# Patient Record
Sex: Female | Born: 1960 | Race: White | Hispanic: No | Marital: Married | Smoking: Current every day smoker
Health system: Southern US, Community
[De-identification: ages and names within clinical notes are randomized; demographics above are authoritative.]

---

## 2002-07-20 ENCOUNTER — Encounter: Payer: Self-pay | Admitting: Internal Medicine

## 2002-07-20 ENCOUNTER — Encounter: Admission: RE | Admit: 2002-07-20 | Discharge: 2002-07-20 | Payer: Self-pay | Admitting: Internal Medicine

## 2003-10-24 ENCOUNTER — Encounter: Admission: RE | Admit: 2003-10-24 | Discharge: 2003-10-24 | Payer: Self-pay | Admitting: Internal Medicine

## 2004-04-16 ENCOUNTER — Encounter: Admission: RE | Admit: 2004-04-16 | Discharge: 2004-04-16 | Payer: Self-pay | Admitting: Internal Medicine

## 2004-11-09 ENCOUNTER — Encounter: Admission: RE | Admit: 2004-11-09 | Discharge: 2004-11-09 | Payer: Self-pay | Admitting: Internal Medicine

## 2004-11-24 ENCOUNTER — Encounter: Admission: RE | Admit: 2004-11-24 | Discharge: 2004-11-24 | Payer: Self-pay | Admitting: Internal Medicine

## 2005-06-02 ENCOUNTER — Encounter: Admission: RE | Admit: 2005-06-02 | Discharge: 2005-06-02 | Payer: Self-pay | Admitting: Internal Medicine

## 2005-11-15 ENCOUNTER — Encounter: Admission: RE | Admit: 2005-11-15 | Discharge: 2005-11-15 | Payer: Self-pay | Admitting: Internal Medicine

## 2007-09-13 ENCOUNTER — Encounter: Admission: RE | Admit: 2007-09-13 | Discharge: 2007-09-13 | Payer: Self-pay | Admitting: Internal Medicine

## 2007-09-13 ENCOUNTER — Emergency Department (HOSPITAL_COMMUNITY): Admission: EM | Admit: 2007-09-13 | Discharge: 2007-09-13 | Payer: Self-pay | Admitting: Emergency Medicine

## 2010-03-12 ENCOUNTER — Encounter: Admission: RE | Admit: 2010-03-12 | Discharge: 2010-03-12 | Payer: Self-pay | Admitting: Internal Medicine

## 2010-09-20 ENCOUNTER — Encounter: Payer: Self-pay | Admitting: Internal Medicine

## 2011-05-04 ENCOUNTER — Other Ambulatory Visit: Payer: Self-pay | Admitting: Internal Medicine

## 2011-05-04 DIAGNOSIS — Z1231 Encounter for screening mammogram for malignant neoplasm of breast: Secondary | ICD-10-CM

## 2011-05-12 ENCOUNTER — Ambulatory Visit
Admission: RE | Admit: 2011-05-12 | Discharge: 2011-05-12 | Disposition: A | Discharge status: Home / Self Care | Payer: BC Managed Care – PPO | Source: Ambulatory Visit | Attending: Internal Medicine | Admitting: Internal Medicine

## 2011-05-12 DIAGNOSIS — Z1231 Encounter for screening mammogram for malignant neoplasm of breast: Secondary | ICD-10-CM

## 2011-05-20 LAB — I-STAT 8, (EC8 V) (CONVERTED LAB)
Bicarbonate: 26.4 — ABNORMAL HIGH
Potassium: 3.8
TCO2: 28
pCO2, Ven: 44.4 — ABNORMAL LOW

## 2012-06-13 ENCOUNTER — Other Ambulatory Visit: Payer: Self-pay | Admitting: Internal Medicine

## 2012-06-13 DIAGNOSIS — Z1231 Encounter for screening mammogram for malignant neoplasm of breast: Secondary | ICD-10-CM

## 2012-07-05 ENCOUNTER — Ambulatory Visit
Admission: RE | Admit: 2012-07-05 | Discharge: 2012-07-05 | Disposition: A | Discharge status: Home / Self Care | Payer: BC Managed Care – PPO | Source: Ambulatory Visit | Attending: Internal Medicine | Admitting: Internal Medicine

## 2012-07-05 DIAGNOSIS — Z1231 Encounter for screening mammogram for malignant neoplasm of breast: Secondary | ICD-10-CM

## 2015-02-17 ENCOUNTER — Ambulatory Visit
Admission: RE | Admit: 2015-02-17 | Discharge: 2015-02-17 | Disposition: A | Discharge status: Home / Self Care | Payer: Commercial Managed Care - PPO | Source: Ambulatory Visit | Attending: Internal Medicine | Admitting: Internal Medicine

## 2015-02-17 ENCOUNTER — Other Ambulatory Visit: Payer: Self-pay | Admitting: Internal Medicine

## 2015-02-17 DIAGNOSIS — R079 Chest pain, unspecified: Secondary | ICD-10-CM

## 2015-12-04 ENCOUNTER — Other Ambulatory Visit: Payer: Self-pay

## 2015-12-04 ENCOUNTER — Other Ambulatory Visit: Payer: Self-pay | Admitting: Internal Medicine

## 2015-12-04 DIAGNOSIS — Z1231 Encounter for screening mammogram for malignant neoplasm of breast: Secondary | ICD-10-CM

## 2015-12-23 ENCOUNTER — Ambulatory Visit: Payer: Commercial Managed Care - PPO

## 2016-01-12 ENCOUNTER — Ambulatory Visit
Admission: RE | Admit: 2016-01-12 | Discharge: 2016-01-12 | Disposition: A | Discharge status: Home / Self Care | Payer: Commercial Managed Care - PPO | Source: Ambulatory Visit | Attending: Internal Medicine | Admitting: Internal Medicine

## 2016-01-12 DIAGNOSIS — Z1231 Encounter for screening mammogram for malignant neoplasm of breast: Secondary | ICD-10-CM

## 2017-05-10 ENCOUNTER — Other Ambulatory Visit: Payer: Self-pay | Admitting: Internal Medicine

## 2017-05-10 ENCOUNTER — Ambulatory Visit
Admission: RE | Admit: 2017-05-10 | Discharge: 2017-05-10 | Disposition: A | Discharge status: Home / Self Care | Payer: Commercial Managed Care - PPO | Source: Ambulatory Visit | Attending: Internal Medicine | Admitting: Internal Medicine

## 2017-05-10 DIAGNOSIS — Z720 Tobacco use: Secondary | ICD-10-CM

## 2017-05-10 DIAGNOSIS — M79601 Pain in right arm: Secondary | ICD-10-CM

## 2019-06-04 ENCOUNTER — Other Ambulatory Visit: Payer: Self-pay

## 2019-06-04 ENCOUNTER — Other Ambulatory Visit: Payer: Self-pay | Admitting: Internal Medicine

## 2019-06-04 DIAGNOSIS — Z1231 Encounter for screening mammogram for malignant neoplasm of breast: Secondary | ICD-10-CM

## 2019-08-28 ENCOUNTER — Ambulatory Visit
Admission: RE | Admit: 2019-08-28 | Discharge: 2019-08-28 | Disposition: A | Discharge status: Home / Self Care | Payer: Commercial Managed Care - PPO | Source: Ambulatory Visit | Attending: Internal Medicine | Admitting: Internal Medicine

## 2019-08-28 ENCOUNTER — Other Ambulatory Visit: Payer: Self-pay

## 2019-08-28 DIAGNOSIS — Z1231 Encounter for screening mammogram for malignant neoplasm of breast: Secondary | ICD-10-CM

## 2019-08-29 ENCOUNTER — Other Ambulatory Visit: Payer: Self-pay | Admitting: Internal Medicine

## 2019-08-29 DIAGNOSIS — R928 Other abnormal and inconclusive findings on diagnostic imaging of breast: Secondary | ICD-10-CM

## 2019-09-03 ENCOUNTER — Other Ambulatory Visit: Payer: Self-pay | Admitting: Internal Medicine

## 2019-09-03 ENCOUNTER — Ambulatory Visit
Admission: RE | Admit: 2019-09-03 | Discharge: 2019-09-03 | Disposition: A | Discharge status: Home / Self Care | Payer: Commercial Managed Care - PPO | Source: Ambulatory Visit | Attending: Internal Medicine | Admitting: Internal Medicine

## 2019-09-03 ENCOUNTER — Other Ambulatory Visit: Payer: Self-pay

## 2019-09-03 DIAGNOSIS — R921 Mammographic calcification found on diagnostic imaging of breast: Secondary | ICD-10-CM

## 2019-09-03 DIAGNOSIS — R928 Other abnormal and inconclusive findings on diagnostic imaging of breast: Secondary | ICD-10-CM

## 2019-09-10 ENCOUNTER — Other Ambulatory Visit: Payer: Self-pay

## 2019-09-10 ENCOUNTER — Ambulatory Visit
Admission: RE | Admit: 2019-09-10 | Discharge: 2019-09-10 | Disposition: A | Discharge status: Home / Self Care | Payer: Commercial Managed Care - PPO | Source: Ambulatory Visit | Attending: Internal Medicine | Admitting: Internal Medicine

## 2019-09-10 ENCOUNTER — Other Ambulatory Visit: Payer: Self-pay | Admitting: Internal Medicine

## 2019-09-10 DIAGNOSIS — R921 Mammographic calcification found on diagnostic imaging of breast: Secondary | ICD-10-CM

## 2019-09-19 ENCOUNTER — Ambulatory Visit: Payer: Commercial Managed Care - PPO | Attending: Internal Medicine

## 2019-09-19 DIAGNOSIS — Z23 Encounter for immunization: Secondary | ICD-10-CM | POA: Insufficient documentation

## 2019-09-19 NOTE — Progress Notes (Signed)
   Covid-19 Vaccination Clinic  Name:  ALIZEY NOREN    MRN: [id number] DOB: [birth year]  09/19/2019  Ms. Hildenbrand was observed post Covid-19 immunization for 15 minutes without incidence. She was provided with Vaccine Information Sheet and instruction to access the V-Safe system.   Ms. Lasota was instructed to call 911 with any severe reactions post vaccine: Marland Kitchen Difficulty breathing  . Swelling of your face and throat  . A fast heartbeat  . A bad rash all over your body  . Dizziness and weakness    Immunizations Administered    Name Date Dose VIS Date Route   Pfizer COVID-19 Vaccine 09/19/2019  1:18 PM 0.3 mL 08/10/2019 Intramuscular   Manufacturer: [employer], [employer]   Lot: [id number]   NDC: [address]-1

## 2019-10-02 ENCOUNTER — Ambulatory Visit
Admission: RE | Admit: 2019-10-02 | Discharge: 2019-10-02 | Disposition: A | Discharge status: Home / Self Care | Payer: Commercial Managed Care - PPO | Source: Ambulatory Visit | Attending: Internal Medicine | Admitting: Internal Medicine

## 2019-10-02 ENCOUNTER — Other Ambulatory Visit: Payer: Self-pay

## 2019-10-02 DIAGNOSIS — R921 Mammographic calcification found on diagnostic imaging of breast: Secondary | ICD-10-CM

## 2019-10-07 ENCOUNTER — Ambulatory Visit: Payer: Commercial Managed Care - PPO

## 2019-10-10 ENCOUNTER — Ambulatory Visit: Payer: Commercial Managed Care - PPO | Attending: Internal Medicine

## 2019-10-10 DIAGNOSIS — Z23 Encounter for immunization: Secondary | ICD-10-CM | POA: Insufficient documentation

## 2019-10-10 NOTE — Progress Notes (Signed)
   Covid-19 Vaccination Clinic  Name:  Robyn Young    MRN: [id number] DOB: 1961/08/03  10/10/2019  [name] was observed post Covid-19 immunization for 15 minutes without incidence. She was provided with Vaccine Information Sheet and instruction to access the V-Safe system.   [name] was instructed to call 911 with any severe reactions post vaccine: Marland Kitchen Difficulty breathing  . Swelling of your face and throat  . A fast heartbeat  . A bad rash all over your body  . Dizziness and weakness    Immunizations Administered    Name Date Dose VIS Date Route   Pfizer COVID-19 Vaccine 10/10/2019  3:03 PM 0.3 mL 08/10/2019 Intramuscular   Manufacturer: [employer], [employer]   Lot: [id number]   NDC: [address]-4

## 2020-08-01 ENCOUNTER — Other Ambulatory Visit: Payer: Self-pay

## 2020-08-01 ENCOUNTER — Other Ambulatory Visit: Payer: Self-pay | Admitting: Internal Medicine

## 2020-08-01 ENCOUNTER — Ambulatory Visit
Admission: RE | Admit: 2020-08-01 | Discharge: 2020-08-01 | Disposition: A | Discharge status: Home / Self Care | Payer: Commercial Managed Care - PPO | Source: Ambulatory Visit | Attending: Internal Medicine | Admitting: Internal Medicine

## 2020-08-01 DIAGNOSIS — M549 Dorsalgia, unspecified: Secondary | ICD-10-CM

## 2021-02-19 ENCOUNTER — Other Ambulatory Visit: Payer: Self-pay | Admitting: Internal Medicine

## 2021-02-19 DIAGNOSIS — F17210 Nicotine dependence, cigarettes, uncomplicated: Secondary | ICD-10-CM

## 2021-08-08 ENCOUNTER — Other Ambulatory Visit: Payer: Self-pay | Admitting: Internal Medicine

## 2021-08-08 DIAGNOSIS — F17210 Nicotine dependence, cigarettes, uncomplicated: Secondary | ICD-10-CM

## 2021-10-15 ENCOUNTER — Ambulatory Visit
Admission: RE | Admit: 2021-10-15 | Discharge: 2021-10-15 | Disposition: A | Discharge status: Home / Self Care | Payer: PRIVATE HEALTH INSURANCE | Source: Ambulatory Visit | Attending: Internal Medicine | Admitting: Internal Medicine

## 2021-10-15 ENCOUNTER — Other Ambulatory Visit: Payer: Self-pay

## 2021-10-15 DIAGNOSIS — F17210 Nicotine dependence, cigarettes, uncomplicated: Secondary | ICD-10-CM

## 2021-12-20 ENCOUNTER — Emergency Department (HOSPITAL_BASED_OUTPATIENT_CLINIC_OR_DEPARTMENT_OTHER): Payer: PRIVATE HEALTH INSURANCE

## 2021-12-20 ENCOUNTER — Emergency Department (HOSPITAL_BASED_OUTPATIENT_CLINIC_OR_DEPARTMENT_OTHER)
Admission: EM | Admit: 2021-12-20 | Discharge: 2021-12-20 | Disposition: A | Discharge status: Home / Self Care | Payer: PRIVATE HEALTH INSURANCE | Attending: Emergency Medicine | Admitting: Emergency Medicine

## 2021-12-20 ENCOUNTER — Other Ambulatory Visit: Payer: Self-pay

## 2021-12-20 ENCOUNTER — Emergency Department (HOSPITAL_BASED_OUTPATIENT_CLINIC_OR_DEPARTMENT_OTHER): Payer: PRIVATE HEALTH INSURANCE | Admitting: Radiology

## 2021-12-20 ENCOUNTER — Encounter (HOSPITAL_BASED_OUTPATIENT_CLINIC_OR_DEPARTMENT_OTHER): Payer: Self-pay

## 2021-12-20 DIAGNOSIS — S89302A Unspecified physeal fracture of lower end of left fibula, initial encounter for closed fracture: Secondary | ICD-10-CM | POA: Insufficient documentation

## 2021-12-20 DIAGNOSIS — S82832A Other fracture of upper and lower end of left fibula, initial encounter for closed fracture: Secondary | ICD-10-CM

## 2021-12-20 DIAGNOSIS — S8992XA Unspecified injury of left lower leg, initial encounter: Secondary | ICD-10-CM | POA: Diagnosis present

## 2021-12-20 DIAGNOSIS — X501XXA Overexertion from prolonged static or awkward postures, initial encounter: Secondary | ICD-10-CM | POA: Insufficient documentation

## 2021-12-20 MED ORDER — OXYCODONE-ACETAMINOPHEN 5-325 MG PO TABS
1.0000 | ORAL_TABLET | ORAL | Status: DC | PRN
  Administered 2021-12-20: 1 via ORAL
  Filled 2021-12-20: qty 1

## 2021-12-20 MED ORDER — OXYCODONE-ACETAMINOPHEN 5-325 MG PO TABS: 1.0000 | ORAL_TABLET | Freq: Four times a day (QID) | ORAL | 0 refills | Status: AC | PRN

## 2021-12-20 NOTE — ED Provider Notes (Signed)
?[facility] ?Provider Note ? ? ?CSN: [id number] ?Arrival date & time: 12/20/21  1832 ? ?  ? ?History ? ?Chief Complaint  ?Patient presents with  ? Ankle Pain  ? ? ?Robyn Young is a 61 y.o. female. ? ?Patient is a 4-year-old female who presents with pain in her left ankle.  She was stepping into a manhole that the cover was not securely attached on.  She twisted her ankle.  This happened earlier today.  She has had swelling and pain to the area.  She has difficulty bearing weight.  She denies any other injuries.  She denies hitting her head.  No loss of conscious.  No neck or back pain.  She is not on anticoagulants. ? ? ?  ? ?Home Medications ?Prior to Admission medications   ?Medication Sig Start Date End Date Taking? Authorizing Provider  ?oxyCODONE-acetaminophen (PERCOCET/ROXICET) 5-325 MG tablet Take 1-2 tablets by mouth every 6 (six) hours as needed for severe pain. 12/20/21  Yes Rolan Bucco, MD  ?   ? ?Allergies    ?Lidocaine-epinephrine   ? ?Review of Systems   ?Review of Systems  ?Constitutional:  Negative for fever.  ?Gastrointestinal:  Negative for nausea and vomiting.  ?Musculoskeletal:  Positive for arthralgias and joint swelling. Negative for back pain and neck pain.  ?Skin:  Negative for wound.  ?Neurological:  Negative for weakness, numbness and headaches.  ? ?Physical Exam ?Updated Vital Signs ?BP (!) 172/69 (BP Location: Right Arm)   Pulse 67   Temp 98.3 ?F (36.8 ?C)   Resp 20   Ht 5\' 5"  (1.651 m)   Wt 81.6 kg   SpO2 97%   BMI 29.95 kg/m?  ?Physical Exam ?Constitutional:   ?   Appearance: She is well-developed.  ?HENT:  ?   Head: Normocephalic and atraumatic.  ?Cardiovascular:  ?   Rate and Rhythm: Normal rate.  ?Pulmonary:  ?   Effort: Pulmonary effort is normal.  ?Musculoskeletal:     ?   General: Tenderness present.  ?   Cervical back: Normal range of motion and neck supple.  ?   Comments: Positive swelling and ecchymosis over the lateral aspect of the  left ankle as well as the left foot.  There is tenderness over the lateral malleolus of the left ankle and the fourth and fifth metatarsals of the left foot.  Pedal pulses are intact.  There is no open wounds.  She has normal sensation and motor function distally.  No pain to the knee.  ?Skin: ?   General: Skin is warm and dry.  ?Neurological:  ?   Mental Status: She is alert and oriented to person, place, and time.  ? ? ?ED Results / Procedures / Treatments   ?Labs ?(all labs ordered are listed, but only abnormal results are displayed) ?Labs Reviewed - No data to display ? ?EKG ?None ? ?Radiology ?DG Ankle Complete Left ? ?Result Date: 12/20/2021 ?CLINICAL DATA:  injury with pain EXAM: LEFT ANKLE COMPLETE - 3+ VIEW COMPARISON:  None. FINDINGS: Infrasyndesmotic nondisplaced distal fibular fracture. No acute displaced fracture or dislocation of the ankle. Well corticated ossific density inferior to the medial malleolus likely old healed fracture. There is no evidence of arthropathy or other focal bone abnormality. Soft tissues are unremarkable. IMPRESSION: Infrasyndesmotic nondisplaced distal fibular fracture. Electronically Signed   By: 12/22/2021 M.D.   On: 12/20/2021 19:55  ? ?DG Foot Complete Left ? ?Result Date: 12/20/2021 ?CLINICAL DATA:  Fall, foot  pain EXAM: LEFT FOOT - COMPLETE 3+ VIEW COMPARISON:  None. FINDINGS: No fracture or dislocation is seen. The joint spaces are preserved. Visualized soft tissues are within normal limits. Tiny plantar and posterior calcaneal these of fights. IMPRESSION: Negative. Electronically Signed   By: Charline Bills M.D.   On: 12/20/2021 23:02   ? ?Procedures ?Procedures  ? ? ?Medications Ordered in ED ?Medications  ?oxyCODONE-acetaminophen (PERCOCET/ROXICET) 5-325 MG per tablet 1 tablet (1 tablet Oral Given 12/20/21 2216)  ? ? ?ED Course/ Medical Decision Making/ A&P ?  ?                        ?Medical Decision Making ?Amount and/or Complexity of Data  Reviewed ?Radiology: ordered. ? ?Risk ?Prescription drug management. ? ? ?Patient x-rays of the left ankle and the left foot.  These are interpreted by me.  She has evidence of a nondisplaced distal fibula fracture.  She was placed in a cam walker.  She was advised on ice and elevation.  She was given a referral to follow-up with orthopedics.  She was advised to call tomorrow for an appointment for follow-up.  She was given a prescription for short course of Percocet but also was advised to use ibuprofen for symptomatic relief. ? ?Final Clinical Impression(s) / ED Diagnoses ?Final diagnoses:  ?Closed fracture of distal end of left fibula, unspecified fracture morphology, initial encounter  ? ? ?Rx / DC Orders ?ED Discharge Orders   ? ?      Ordered  ?  oxyCODONE-acetaminophen (PERCOCET/ROXICET) 5-325 MG tablet  Every 6 hours PRN       ? 12/20/21 2320  ? ?  ?  ? ?  ? ? ?  ?Rolan Bucco, MD ?12/20/21 2323 ? ?

## 2021-12-20 NOTE — ED Notes (Signed)
Pt discharged home after verbalizing understanding of discharge instructions; nad noted. 

## 2021-12-20 NOTE — ED Triage Notes (Signed)
Pt stepped into a manhole while walking downtown. Pt reports injury to her L ankle. Significant swelling noted.  ?

## 2021-12-23 ENCOUNTER — Ambulatory Visit (INDEPENDENT_AMBULATORY_CARE_PROVIDER_SITE_OTHER): Payer: PRIVATE HEALTH INSURANCE | Admitting: Orthopaedic Surgery

## 2021-12-23 DIAGNOSIS — S8265XA Nondisplaced fracture of lateral malleolus of left fibula, initial encounter for closed fracture: Secondary | ICD-10-CM

## 2021-12-23 NOTE — Progress Notes (Signed)
? ?                            ? ? ?Chief Complaint: Left ankle pain ?  ? ? ?History of Present Illness:  ? ? ?Robyn Young is a 61 y.o. female presents with left ankle pain after she slipped on a manhole cover and inverted her ankle.  She went to the emergency room on April 23 and subsequently was placed in a cam boot and given crutches.  She presents today for follow-up.  She has been trying to keep her weight off of it since this time.  She has been taking ibuprofen and occasionally Percocet to help sleep.  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  She is here today with her husband JT ? ? ? ?Surgical History:   ?None ? ?PMH/PSH/Family History/Social History/Meds/Allergies:   ?No past medical history on file. ?No past surgical history on file. ?Social History  ? ?Socioeconomic History  ? Marital status: Married  ?  Spouse name: Not on file  ? Number of children: Not on file  ? Years of education: Not on file  ? Highest education level: Not on file  ?Occupational History  ? Not on file  ?Tobacco Use  ? Smoking status: Every Day  ?  Types: Cigarettes  ? Smokeless tobacco: Never  ?Vaping Use  ? Vaping Use: Never used  ?Substance and Sexual Activity  ? Alcohol use: Yes  ? Drug use: Never  ? Sexual activity: Not on file  ?Other Topics Concern  ? Not on file  ?Social History Narrative  ? Not on file  ? ?Social Determinants of Health  ? ?Financial Resource Strain: Not on file  ?Food Insecurity: Not on file  ?Transportation Needs: Not on file  ?Physical Activity: Not on file  ?Stress: Not on file  ?Social Connections: Not on file  ? ?No family history on file. ?Allergies  ?Allergen Reactions  ? Lidocaine-Epinephrine Rash and Other (See Comments)  ?  She had a breast biopsy 09/10/2019 with a severe vasovagal response and took over an hour to recover.  She had a rash that developed on her back within a day afterward.  She said it took a week to recover her energy.  Unclear if this is  all related to the drug, but I repeated a biopsy 10/02/19 and used 2% Nesacaine (no epi), and she had no issues.  ? ?Current Outpatient Medications  ?Medication Sig Dispense Refill  ? oxyCODONE-acetaminophen (PERCOCET/ROXICET) 5-325 MG tablet Take 1-2 tablets by mouth every 6 (six) hours as needed for severe pain. 10 tablet 0  ? ?No current facility-administered medications for this visit.  ? ?No results found. ? ?Review of Systems:   ?A ROS was performed including pertinent positives and negatives as documented in the HPI. ? ?Physical Exam :   ?Constitutional: NAD and appears stated age ?Neurological: Alert and oriented ?Psych: Appropriate affect and cooperative ?There were no vitals taken for this visit.  ? ?Comprehensive Musculoskeletal Exam:   ? ? Right Left  ?Gait Antalgic  ?Musculoskeletal Exam    ?Deformity No deformity No deformity  ?Tenderness None Distal fibula  ?Skin    ?Abrasions None None  ?Blisters None None  ?Range of Motion    ?Ankle Dorsiflexion 20 10  ?Ankle Plantarflexion 30 10  ?Subtalar Joint Inversion/Eversion Full Deferred  ?Stability    ?Dislocations None None  ?  Subluxations or Laxity None None  ?Muscle Strength    ?Single Heel Raise Able Able  ?Sensation    ?Sural Nerve Dist. Normal Normal  ?Saphenous Nerve Dist. Normal Normal  ?Tibial Nerve Dist. Normal Normal  ?Deep Peroneal Nerve Dist. Normal Normal  ?Superficial Peroneal Nerve Dist. Normal Normal  ?Cardiovascular     ?Varicosites None  None  ?DP Artery Pulse Palpable Palpable  ?Capillary Refill <2 sec <2 sec  ?Special Tests:  ?Bruising about the distal fibula  ? ? ? ?Imaging:   ?Xray (3 views left ankle): ?There is a Weber a fibula fracture as well as a very small medial avulsion ? ? ? ?I personally reviewed and interpreted the radiographs. ? ? ?Assessment:   ?61 y.o. female presents with a left Weber a fibular fracture after a trip on a manhole cover.  I described that in general these fractures are very stable.  At this time I would like  her to progress her weightbearing in her cam boot.  I will plan to see her back in 2 weeks.  She was advised on strictly icing and elevating in order to help with swelling. ? ?Plan :   ? ?-Return to clinic in 2 weeks ? ? ? ? ?I personally saw and evaluated the patient, and participated in the management and treatment plan. ? ?Huel Cote, MD ?Attending Physician, Orthopedic Surgery ? ?This document was dictated using Conservation officer, historic buildings. A reasonable attempt at proof reading has been made to minimize errors. ?

## 2022-01-06 ENCOUNTER — Ambulatory Visit (INDEPENDENT_AMBULATORY_CARE_PROVIDER_SITE_OTHER): Payer: PRIVATE HEALTH INSURANCE

## 2022-01-06 ENCOUNTER — Ambulatory Visit (INDEPENDENT_AMBULATORY_CARE_PROVIDER_SITE_OTHER): Payer: PRIVATE HEALTH INSURANCE | Admitting: Orthopaedic Surgery

## 2022-01-06 DIAGNOSIS — S8265XA Nondisplaced fracture of lateral malleolus of left fibula, initial encounter for closed fracture: Secondary | ICD-10-CM

## 2022-01-06 DIAGNOSIS — G5601 Carpal tunnel syndrome, right upper limb: Secondary | ICD-10-CM

## 2022-01-06 DIAGNOSIS — M65331 Trigger finger, right middle finger: Secondary | ICD-10-CM

## 2022-01-06 NOTE — Progress Notes (Signed)
? ?                            ? ? ?Chief Complaint: Left ankle pain ?  ? ? ?History of Present Illness:  ? ? ?Robyn Young is a 61 y.o. female presents follow-up of her left distal fibula fracture.  She has been walking on her cam boot.  She has been able to wean off of this recently.  She was recently able to walk about her house without any pain.  She does have some calling discoloration as well as swelling although this overall continues to improve. ? ? ? ?Surgical History:   ?None ? ?PMH/PSH/Family History/Social History/Meds/Allergies:   ?No past medical history on file. ?No past surgical history on file. ?Social History  ? ?Socioeconomic History  ? Marital status: Married  ?  Spouse name: Not on file  ? Number of children: Not on file  ? Years of education: Not on file  ? Highest education level: Not on file  ?Occupational History  ? Not on file  ?Tobacco Use  ? Smoking status: Every Day  ?  Types: Cigarettes  ? Smokeless tobacco: Never  ?Vaping Use  ? Vaping Use: Never used  ?Substance and Sexual Activity  ? Alcohol use: Yes  ? Drug use: Never  ? Sexual activity: Not on file  ?Other Topics Concern  ? Not on file  ?Social History Narrative  ? Not on file  ? ?Social Determinants of Health  ? ?Financial Resource Strain: Not on file  ?Food Insecurity: Not on file  ?Transportation Needs: Not on file  ?Physical Activity: Not on file  ?Stress: Not on file  ?Social Connections: Not on file  ? ?No family history on file. ?Allergies  ?Allergen Reactions  ? Lidocaine-Epinephrine Rash and Other (See Comments)  ?  She had a breast biopsy 09/10/2019 with a severe vasovagal response and took over an hour to recover.  She had a rash that developed on her back within a day afterward.  She said it took a week to recover her energy.  Unclear if this is all related to the drug, but I repeated a biopsy 10/02/19 and used 2% Nesacaine (no epi), and she had no issues.  ? ?Current Outpatient Medications  ?Medication Sig Dispense  Refill  ? oxyCODONE-acetaminophen (PERCOCET/ROXICET) 5-325 MG tablet Take 1-2 tablets by mouth every 6 (six) hours as needed for severe pain. 10 tablet 0  ? ?No current facility-administered medications for this visit.  ? ?No results found. ? ?Review of Systems:   ?A ROS was performed including pertinent positives and negatives as documented in the HPI. ? ?Physical Exam :   ?Constitutional: NAD and appears stated age ?Neurological: Alert and oriented ?Psych: Appropriate affect and cooperative ?There were no vitals taken for this visit.  ? ?Comprehensive Musculoskeletal Exam:   ? ? Right Left  ?Gait Antalgic  ?Musculoskeletal Exam    ?Deformity No deformity No deformity  ?Tenderness None Distal fibula  ?Skin    ?Abrasions None None  ?Blisters None None  ?Range of Motion    ?Ankle Dorsiflexion 20 10  ?Ankle Plantarflexion 30 10  ?Subtalar Joint Inversion/Eversion Full Deferred  ?Stability    ?Dislocations None None  ?Subluxations or Laxity None None  ?Muscle Strength    ?Single Heel Raise Able Able  ?Sensation    ?Sural Nerve Dist. Normal Normal  ?Saphenous Nerve Dist. Normal Normal  ?Tibial Nerve Dist.  Normal Normal  ?Deep Peroneal Nerve Dist. Normal Normal  ?Superficial Peroneal Nerve Dist. Normal Normal  ?Cardiovascular     ?Varicosites None  None  ?DP Artery Pulse Palpable Palpable  ?Capillary Refill <2 sec <2 sec  ?Special Tests:  ?Bruising about the distal fibula  ? ? ? ?Imaging:   ?Xray (3 views left ankle): ?There is a Weber a fibula fracture as well as a very small medial avulsion, there is interval healing ? ? ? ?I personally reviewed and interpreted the radiographs. ? ? ?Assessment:   ?61 y.o. female presents with a left Weber a fibular fracture after a trip on a manhole cover.  She is overall doing much better from this perspective.  X-rays show healing.  At this time I would like her to continue weaning out of her brace and she will see her back in 6 weeks. ? ?Plan :   ? ?-Return to clinic in 6  weeks ?-Plan for referral to Dr. Frazier Butt for right hand carpal tunnel as well as index finger middle finger trigger fingers ? ? ? ? ?I personally saw and evaluated the patient, and participated in the management and treatment plan. ? ?Huel Cote, MD ?Attending Physician, Orthopedic Surgery ? ?This document was dictated using Conservation officer, historic buildings. A reasonable attempt at proof reading has been made to minimize errors. ?

## 2022-01-14 ENCOUNTER — Ambulatory Visit (INDEPENDENT_AMBULATORY_CARE_PROVIDER_SITE_OTHER): Payer: PRIVATE HEALTH INSURANCE | Admitting: Orthopedic Surgery

## 2022-01-14 ENCOUNTER — Encounter: Payer: Self-pay | Admitting: Orthopedic Surgery

## 2022-01-14 DIAGNOSIS — M65331 Trigger finger, right middle finger: Secondary | ICD-10-CM | POA: Diagnosis not present

## 2022-01-14 DIAGNOSIS — M65321 Trigger finger, right index finger: Secondary | ICD-10-CM | POA: Diagnosis not present

## 2022-01-14 DIAGNOSIS — R200 Anesthesia of skin: Secondary | ICD-10-CM | POA: Insufficient documentation

## 2022-01-14 DIAGNOSIS — R202 Paresthesia of skin: Secondary | ICD-10-CM

## 2022-01-14 MED ORDER — BETAMETHASONE SOD PHOS & ACET 6 (3-3) MG/ML IJ SUSP
6.0000 mg | INTRAMUSCULAR | Status: AC | PRN
  Administered 2022-01-14: 6 mg via INTRA_ARTICULAR

## 2022-01-14 NOTE — Progress Notes (Signed)
Office Visit Note   Patient: Robyn Young           Date of Birth: 05/28/1961           MRN: [id number] Visit Date: 01/14/2022              Requested by: Huel Cote, MD [address] [place],  [place] [address] PCP: Ralene Ok, MD   Assessment & Plan: Visit Diagnoses:  1. Numbness and tingling in right hand   2. Acquired trigger finger of right index finger   3. Trigger finger, right middle finger     Plan: We discussed that her numbness and tingling in the right hand may be consistent with carpal tunnel syndrome.  I like to get an EMG/nerve conduction study to further evaluate her symptoms.  We reviewed the nature of trigger finger as well as its diagnosis, prognosis, and both conservative and surgical treatment options.  After our discussion, she like to proceed with corticosteroid injection into the right middle and index finger A1 pulleys.  I can see her back once the electrodiagnostic study is completed and we can review the results.  Follow-Up Instructions: No follow-ups on file.   Orders:  No orders of the defined types were placed in this encounter.  No orders of the defined types were placed in this encounter.     Procedures: Hand/UE Inj: R index A1 for trigger finger on 01/14/2022 3:25 PM Indications: tendon swelling and therapeutic Details: 25 G needle, volar approach Medications: 6 mg betamethasone acetate-betamethasone sodium phosphate 6 (3-3) MG/ML Procedure, treatment alternatives, risks and benefits explained, specific risks discussed. Consent was given by the patient. Immediately prior to procedure a time out was called to verify the correct patient, procedure, equipment, support staff and site/side marked as required. Patient was prepped and draped in the usual sterile fashion.    Hand/UE Inj: R long A1 for trigger finger on 01/14/2022 3:25 PM Indications: tendon swelling and therapeutic Details: 25 G needle, volar  approach Medications: 6 mg betamethasone acetate-betamethasone sodium phosphate 6 (3-3) MG/ML Procedure, treatment alternatives, risks and benefits explained, specific risks discussed. Consent was given by the patient. Immediately prior to procedure a time out was called to verify the correct patient, procedure, equipment, support staff and site/side marked as required. Patient was prepped and draped in the usual sterile fashion.      Clinical Data: No additional findings.   Subjective: Chief Complaint  Patient presents with   Right Hand - New Patient (Initial Visit)    This is a 61 year old right-hand-dominant female who presents with numbness and paresthesias involving the index, middle, and occasionally thumb.  This been going on for several months now.  She has numbness during the day with certain activities such as prolonged typing.  She also has nocturnal symptoms 7 nights a week and when she has to wake and shake her hand for symptom relief.  She has not had any treatment for this so far.  She is never had any electrodiagnostic studies done.  She also notes painful swelling and locking of the right index and middle fingers since a ground-level fall in April when she landed on her outstretched right hand.  The index finger is most affected and will occasionally become stuck in a flexed position.   Review of Systems   Objective: Vital Signs: There were no vitals taken for this visit.  Physical Exam Constitutional:      Appearance: Normal appearance.  Cardiovascular:  Rate and Rhythm: Normal rate.     Pulses: Normal pulses.  Pulmonary:     Effort: Pulmonary effort is normal.  Skin:    General: Skin is warm and dry.     Capillary Refill: Capillary refill takes less than 2 seconds.  Neurological:     Mental Status: She is alert.    Right Hand Exam   Tenderness  Right hand tenderness location: TTP at index and middle fingers over A1 pulley.  Range of Motion  The  patient has normal right wrist ROM.   Other  Erythema: absent Sensation: normal Pulse: present  Comments:  Palpable nodule at A1 pulley of index finger.  Limited middle finger ROM secondary to pain and apprehension.  Equivocal Tinel sign.  Positive Phalen and Durkan signs.  5/5 thenar motor strength.      Specialty Comments:  No specialty comments available.  Imaging: No results found.   PMFS History: Patient Active Problem List   Diagnosis Date Noted   Numbness and tingling in right hand 01/14/2022   Acquired trigger finger of right index finger 01/14/2022   Trigger finger, right middle finger 01/14/2022   History reviewed. No pertinent past medical history.  History reviewed. No pertinent family history.  History reviewed. No pertinent surgical history. Social History   Occupational History   Not on file  Tobacco Use   Smoking status: Every Day    Types: Cigarettes   Smokeless tobacco: Never  Vaping Use   Vaping Use: Never used  Substance and Sexual Activity   Alcohol use: Yes   Drug use: Never   Sexual activity: Not on file

## 2022-02-17 ENCOUNTER — Ambulatory Visit (INDEPENDENT_AMBULATORY_CARE_PROVIDER_SITE_OTHER): Payer: PRIVATE HEALTH INSURANCE | Admitting: Orthopaedic Surgery

## 2022-02-17 ENCOUNTER — Ambulatory Visit (INDEPENDENT_AMBULATORY_CARE_PROVIDER_SITE_OTHER): Payer: PRIVATE HEALTH INSURANCE

## 2022-02-17 DIAGNOSIS — S8265XA Nondisplaced fracture of lateral malleolus of left fibula, initial encounter for closed fracture: Secondary | ICD-10-CM

## 2022-02-17 NOTE — Progress Notes (Signed)
Chief Complaint: Left ankle pain     History of Present Illness:    Robyn Young is a 61 y.o. female presents follow-up of her left distal fibula fracture.  At this time she has been walking essentially normally.  She does have some swelling at the end of a long day.  She recently went to [place] for a work trip and was able to walk in a normal shoe without much pain.  There is some soreness at the end of a long day.   Surgical History:   None  PMH/PSH/Family History/Social History/Meds/Allergies:   No past medical history on file. No past surgical history on file. Social History   Socioeconomic History   Marital status: Married    Spouse name: Not on file   Number of children: Not on file   Years of education: Not on file   Highest education level: Not on file  Occupational History   Not on file  Tobacco Use   Smoking status: Every Day    Types: Cigarettes   Smokeless tobacco: Never  Vaping Use   Vaping Use: Never used  Substance and Sexual Activity   Alcohol use: Yes   Drug use: Never   Sexual activity: Not on file  Other Topics Concern   Not on file  Social History Narrative   Not on file   Social Determinants of Health   Financial Resource Strain: Not on file  Food Insecurity: Not on file  Transportation Needs: Not on file  Physical Activity: Not on file  Stress: Not on file  Social Connections: Not on file   No family history on file. Allergies  Allergen Reactions   Lidocaine-Epinephrine Rash and Other (See Comments)    She had a breast biopsy 09/10/2019 with a severe vasovagal response and took over an hour to recover.  She had a rash that developed on her back within a day afterward.  She said it took a week to recover her energy.  Unclear if this is all related to the drug, but I repeated a biopsy 10/02/19 and used 2% Nesacaine (no epi), and she had no issues.   Current Outpatient Medications  Medication Sig  Dispense Refill   oxyCODONE-acetaminophen (PERCOCET/ROXICET) 5-325 MG tablet Take 1-2 tablets by mouth every 6 (six) hours as needed for severe pain. 10 tablet 0   No current facility-administered medications for this visit.   No results found.  Review of Systems:   A ROS was performed including pertinent positives and negatives as documented in the HPI.  Physical Exam :   Constitutional: NAD and appears stated age Neurological: Alert and oriented Psych: Appropriate affect and cooperative There were no vitals taken for this visit.   Comprehensive Musculoskeletal Exam:     Right Left  Gait Antalgic  Musculoskeletal Exam    Deformity No deformity No deformity  Tenderness None Distal fibula  Skin    Abrasions None None  Blisters None None  Range of Motion    Ankle Dorsiflexion 20 10  Ankle Plantarflexion 30 10  Subtalar Joint Inversion/Eversion Full Deferred  Stability    Dislocations None None  Subluxations or Laxity None None  Muscle Strength    Single Heel Raise Able Able  Sensation    Sural Nerve Dist. Normal Normal  Saphenous Nerve Dist.  Normal Normal  Tibial Nerve Dist. Normal Normal  Deep Peroneal Nerve Dist. Normal Normal  Superficial Peroneal Nerve Dist. Normal Normal  Cardiovascular     Varicosites None  None  DP Artery Pulse Palpable Palpable  Capillary Refill <2 sec <2 sec  Special Tests:  Mild tenderness about the distal fibula     Imaging:   Xray (3 views left ankle): There is a Weber a fibula fracture as well as a very small medial avulsion, there is some interval healing    I personally reviewed and interpreted the radiographs.   Assessment:   61 y.o. female presents with a left Weber a fibular fracture after a trip on a manhole cover.  X-rays show some interval callus although there still is a fracture lucency seen.  I will plan to see her back in 2 months for final x-ray.  At this time she may be activity as tolerated.  I have counseled her  on icing and elevating the ankle as well when she has been more active  Plan :    -Return to clinic in 8 weeks     I personally saw and evaluated the patient, and participated in the management and treatment plan.  Huel Cote, MD Attending Physician, Orthopedic Surgery  This document was dictated using Dragon voice recognition software. A reasonable attempt at proof reading has been made to minimize errors.

## 2022-02-24 ENCOUNTER — Other Ambulatory Visit: Payer: Self-pay | Admitting: Internal Medicine

## 2022-02-24 DIAGNOSIS — E2839 Other primary ovarian failure: Secondary | ICD-10-CM

## 2022-02-24 DIAGNOSIS — Z1231 Encounter for screening mammogram for malignant neoplasm of breast: Secondary | ICD-10-CM

## 2022-03-10 ENCOUNTER — Ambulatory Visit
Admission: RE | Admit: 2022-03-10 | Discharge: 2022-03-10 | Disposition: A | Discharge status: Home / Self Care | Payer: PRIVATE HEALTH INSURANCE | Source: Ambulatory Visit | Attending: Internal Medicine | Admitting: Internal Medicine

## 2022-03-10 DIAGNOSIS — Z1231 Encounter for screening mammogram for malignant neoplasm of breast: Secondary | ICD-10-CM

## 2022-04-14 ENCOUNTER — Ambulatory Visit (HOSPITAL_BASED_OUTPATIENT_CLINIC_OR_DEPARTMENT_OTHER): Payer: PRIVATE HEALTH INSURANCE | Admitting: Orthopaedic Surgery

## 2022-04-16 ENCOUNTER — Ambulatory Visit (INDEPENDENT_AMBULATORY_CARE_PROVIDER_SITE_OTHER): Payer: PRIVATE HEALTH INSURANCE

## 2022-04-16 ENCOUNTER — Ambulatory Visit (INDEPENDENT_AMBULATORY_CARE_PROVIDER_SITE_OTHER): Payer: PRIVATE HEALTH INSURANCE | Admitting: Orthopaedic Surgery

## 2022-04-16 DIAGNOSIS — S8265XA Nondisplaced fracture of lateral malleolus of left fibula, initial encounter for closed fracture: Secondary | ICD-10-CM

## 2022-04-16 NOTE — Progress Notes (Signed)
Chief Complaint: Left ankle pain     History of Present Illness:   04/16/2022: Follow-up for left ankle.  Overall she is doing extremely well.  She does have some swelling at the end of the long day but she overall continues to improve.  KHALAYA MCGURN is a 61 y.o. female presents follow-up of her left distal fibula fracture.  At this time she has been walking essentially normally.  She does have some swelling at the end of a long day.  She recently went to [place] for a work trip and was able to walk in a normal shoe without much pain.  There is some soreness at the end of a long day.   Surgical History:   None  PMH/PSH/Family History/Social History/Meds/Allergies:   No past medical history on file. No past surgical history on file. Social History   Socioeconomic History   Marital status: Married    Spouse name: Not on file   Number of children: Not on file   Years of education: Not on file   Highest education level: Not on file  Occupational History   Not on file  Tobacco Use   Smoking status: Every Day    Types: Cigarettes   Smokeless tobacco: Never  Vaping Use   Vaping Use: Never used  Substance and Sexual Activity   Alcohol use: Yes   Drug use: Never   Sexual activity: Not on file  Other Topics Concern   Not on file  Social History Narrative   Not on file   Social Determinants of Health   Financial Resource Strain: Not on file  Food Insecurity: Not on file  Transportation Needs: Not on file  Physical Activity: Not on file  Stress: Not on file  Social Connections: Not on file   No family history on file. Allergies  Allergen Reactions   Lidocaine-Epinephrine Rash and Other (See Comments)    She had a breast biopsy 09/10/2019 with a severe vasovagal response and took over an hour to recover.  She had a rash that developed on her back within a day afterward.  She said it took a week to recover her energy.  Unclear if this  is all related to the drug, but I repeated a biopsy 10/02/19 and used 2% Nesacaine (no epi), and she had no issues.   Current Outpatient Medications  Medication Sig Dispense Refill   oxyCODONE-acetaminophen (PERCOCET/ROXICET) 5-325 MG tablet Take 1-2 tablets by mouth every 6 (six) hours as needed for severe pain. 10 tablet 0   No current facility-administered medications for this visit.   No results found.  Review of Systems:   A ROS was performed including pertinent positives and negatives as documented in the HPI.  Physical Exam :   Constitutional: NAD and appears stated age Neurological: Alert and oriented Psych: Appropriate affect and cooperative There were no vitals taken for this visit.   Comprehensive Musculoskeletal Exam:     Right Left  Gait Antalgic  Musculoskeletal Exam    Deformity No deformity No deformity  Tenderness None None  Skin    Abrasions None None  Blisters None None  Range of Motion    Ankle Dorsiflexion 20 20  Ankle Plantarflexion 30 30  Subtalar Joint Inversion/Eversion Full Full  Stability    Dislocations None None  Subluxations or Laxity None None  Muscle Strength    Single Heel Raise Able Able  Sensation    Sural Nerve Dist. Normal Normal  Saphenous Nerve Dist. Normal Normal  Tibial Nerve Dist. Normal Normal  Deep Peroneal Nerve Dist. Normal Normal  Superficial Peroneal Nerve Dist. Normal Normal  Cardiovascular     Varicosites None  None  DP Artery Pulse Palpable Palpable  Capillary Refill <2 sec <2 sec  Special Tests:  Mild tenderness about the distal fibula     Imaging:   Xray (3 views left ankle): There is a Weber a fibula fracture as well as a very small medial avulsion, there is some interval healing    I personally reviewed and interpreted the radiographs.   Assessment:   61 y.o. female presents with a left Weber a fibular fracture after a trip on a manhole cover.  Overall she is continuing to do well.  She will be  activity as tolerated at this time.  I will plan to see him back on an as-needed basis Plan :    -Return to clinic as needed     I personally saw and evaluated the patient, and participated in the management and treatment plan.  Huel Cote, MD Attending Physician, Orthopedic Surgery  This document was dictated using Dragon voice recognition software. A reasonable attempt at proof reading has been made to minimize errors.

## 2022-12-20 IMAGING — DX DG ANKLE COMPLETE 3+V*L*
3 series · 3 of 3 positions shown · non-contrast
Comparison: January 06, 2022

CLINICAL DATA: Follow-up left ankle fracture

EXAM:
LEFT ANKLE COMPLETE - 3+ VIEW

[ankle ap]
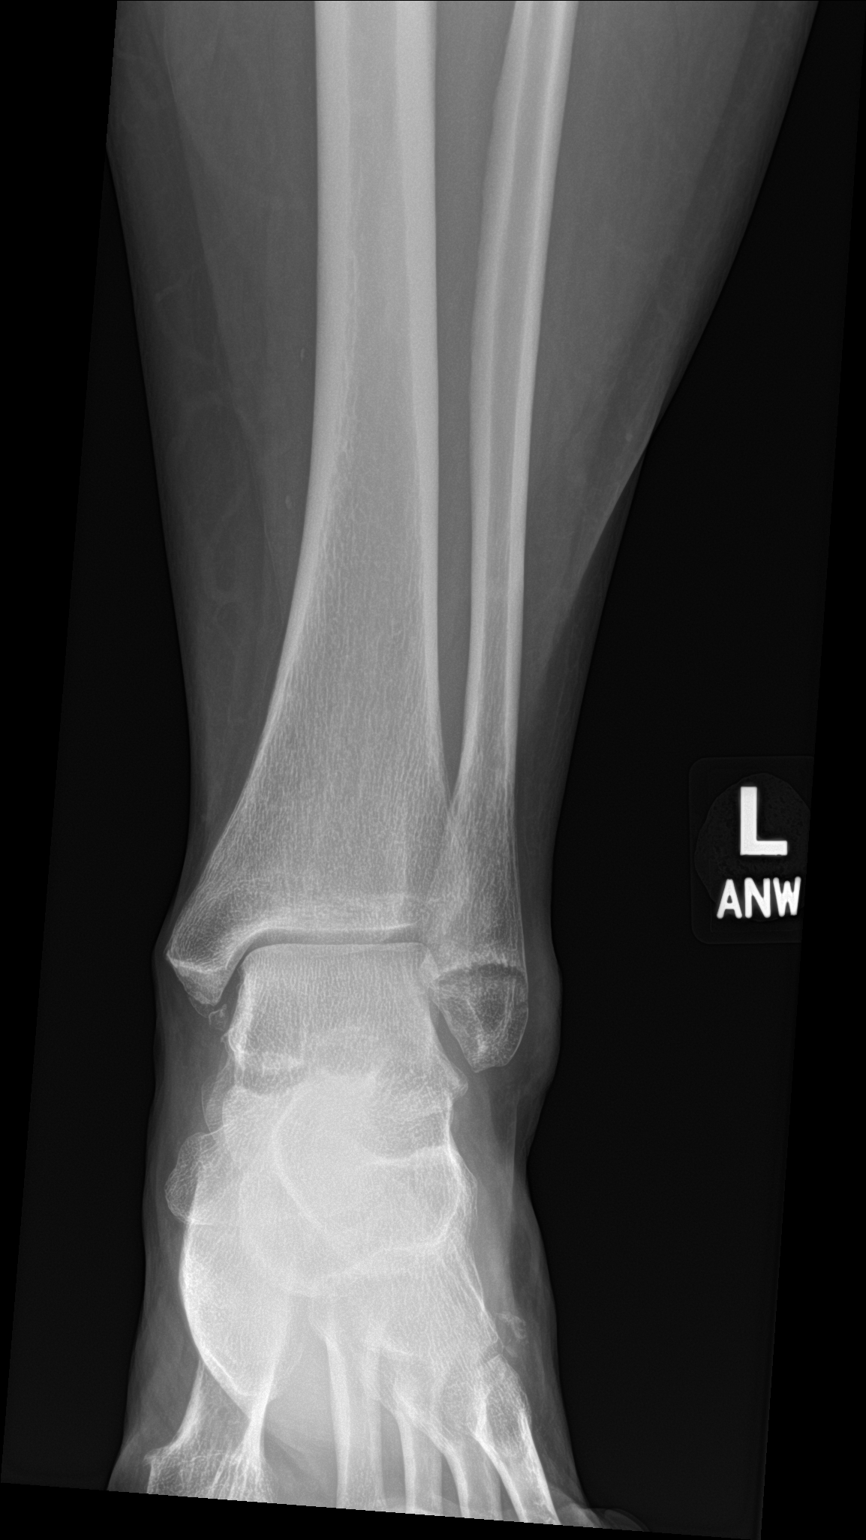

[ankle obl]
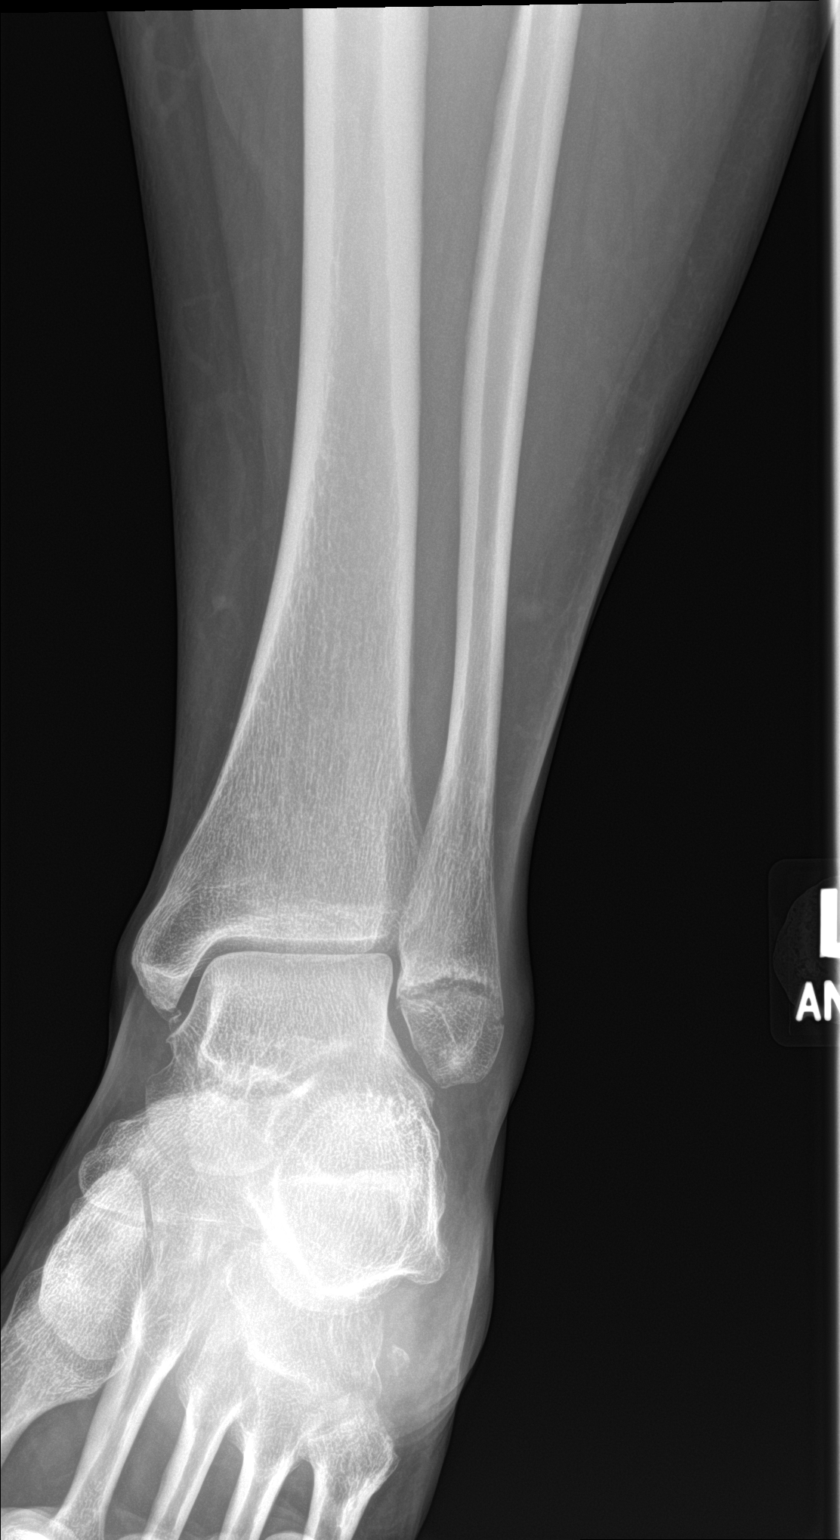

[ankle lat]
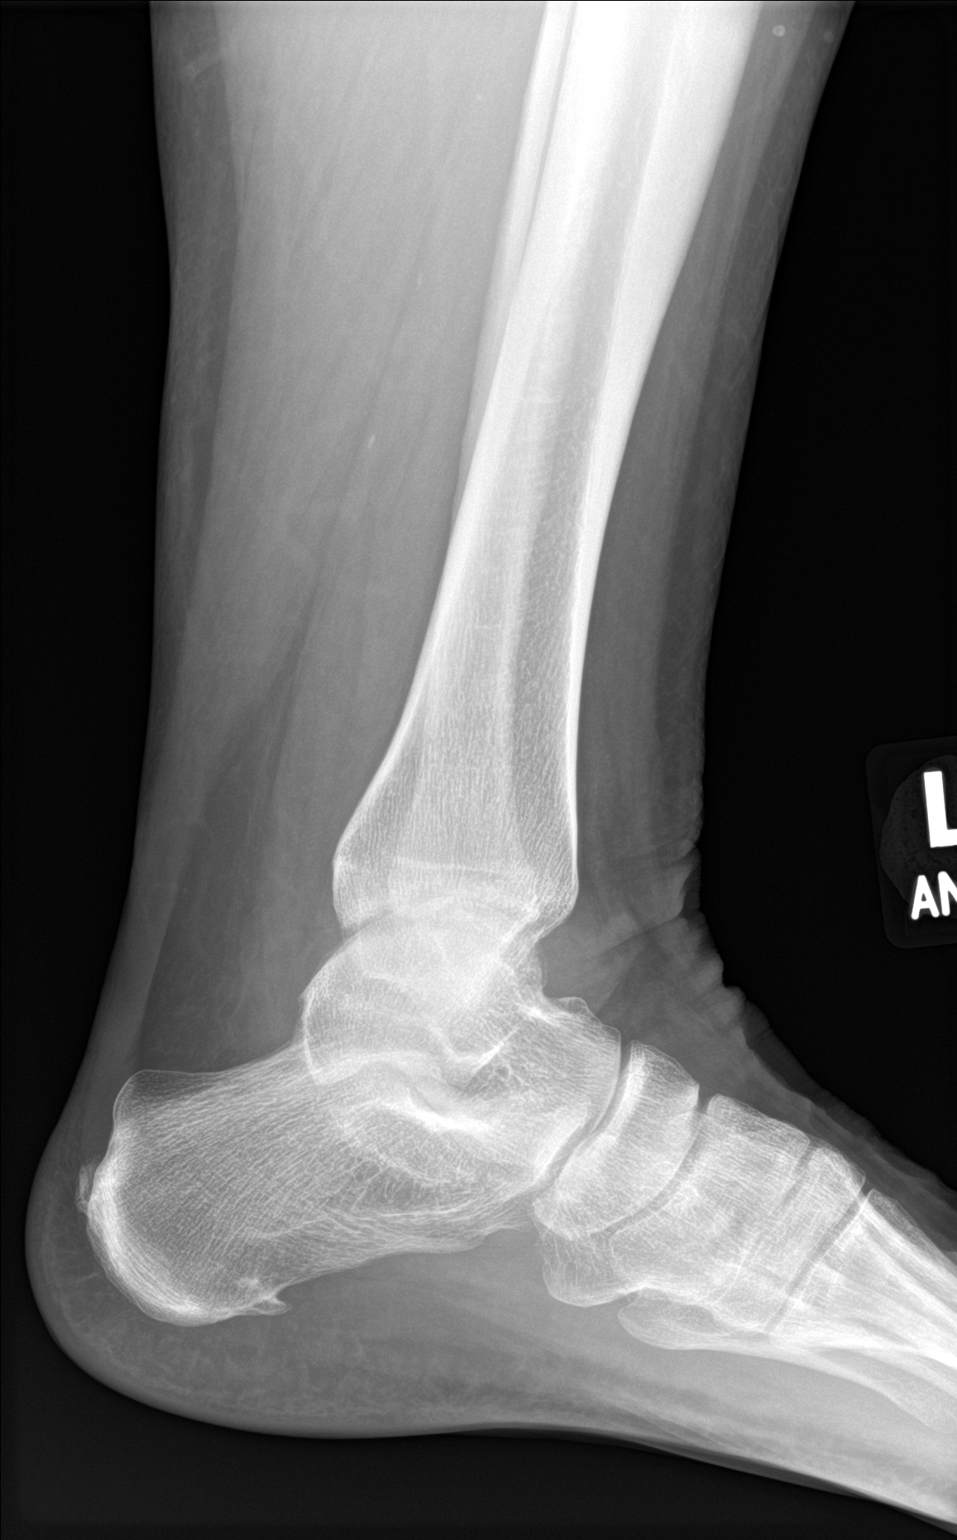

[3 of 3 positions shown; findings below may reference images not displayed]

FINDINGS: Fracture of distal fibula is identified without significant callus
formation, with increased sclerosis at the proximal fracture
surface. Mild soft tissue swelling is noted in the lateral left
ankle improved compared prior exam. Chronic change distal to the
medial malleolus is unchanged. Small plantar calcaneal spur is
noted.
IMPRESSION: Fracture of distal fibula without significant callus formation, but
there is increased sclerosis at the proximal fracture surface. Mild
soft tissue swelling of the lateral left ankle improved compared
prior exam.
